# Patient Record
Sex: Male | Born: 1995
Health system: Southern US, Community
[De-identification: ages and names within clinical notes are randomized; demographics above are authoritative.]

---

## 2019-02-10 ENCOUNTER — Encounter (HOSPITAL_COMMUNITY): Payer: Self-pay | Admitting: Emergency Medicine

## 2019-02-10 ENCOUNTER — Other Ambulatory Visit: Payer: Self-pay

## 2019-02-10 ENCOUNTER — Observation Stay (HOSPITAL_COMMUNITY)
Admission: EM | Admit: 2019-02-10 | Discharge: 2019-02-11 | Disposition: A | Discharge status: Home / Self Care | Payer: HRSA Program | Attending: Family Medicine | Admitting: Family Medicine

## 2019-02-10 ENCOUNTER — Emergency Department (HOSPITAL_COMMUNITY): Payer: HRSA Program

## 2019-02-10 DIAGNOSIS — R0602 Shortness of breath: Secondary | ICD-10-CM | POA: Diagnosis present

## 2019-02-10 DIAGNOSIS — J9601 Acute respiratory failure with hypoxia: Secondary | ICD-10-CM | POA: Insufficient documentation

## 2019-02-10 DIAGNOSIS — Z886 Allergy status to analgesic agent status: Secondary | ICD-10-CM | POA: Diagnosis not present

## 2019-02-10 DIAGNOSIS — Z20822 Contact with and (suspected) exposure to covid-19: Secondary | ICD-10-CM

## 2019-02-10 DIAGNOSIS — U071 COVID-19: Secondary | ICD-10-CM | POA: Diagnosis not present

## 2019-02-10 DIAGNOSIS — R0902 Hypoxemia: Secondary | ICD-10-CM | POA: Diagnosis not present

## 2019-02-10 DIAGNOSIS — J1289 Other viral pneumonia: Secondary | ICD-10-CM | POA: Insufficient documentation

## 2019-02-10 DIAGNOSIS — J1282 Pneumonia due to coronavirus disease 2019: Secondary | ICD-10-CM

## 2019-02-10 HISTORY — DX: COVID-19: U07.1

## 2019-02-10 HISTORY — DX: Pneumonia due to coronavirus disease 2019: J12.82

## 2019-02-10 LAB — COMPREHENSIVE METABOLIC PANEL
ALT: 49 U/L — ABNORMAL HIGH (ref 0–44)
AST: 38 U/L (ref 15–41)
Albumin: 3.6 g/dL (ref 3.5–5.0)
Alkaline Phosphatase: 51 U/L (ref 38–126)
Anion gap: 11 (ref 5–15)
BUN: 8 mg/dL (ref 6–20)
CO2: 21 mmol/L — ABNORMAL LOW (ref 22–32)
Calcium: 8.5 mg/dL — ABNORMAL LOW (ref 8.9–10.3)
Chloride: 104 mmol/L (ref 98–111)
Creatinine, Ser: 0.94 mg/dL (ref 0.61–1.24)
GFR calc Af Amer: >60 mL/min (ref >60)
GFR calc non Af Amer: >60 mL/min (ref >60)
Glucose, Bld: 118 mg/dL — ABNORMAL HIGH (ref 70–99)
Potassium: 3.7 mmol/L (ref 3.5–5.1)
Sodium: 136 mmol/L (ref 135–145)
Total Bilirubin: 0.4 mg/dL (ref 0.3–1.2)
Total Protein: 7 g/dL (ref 6.5–8.1)

## 2019-02-10 LAB — CBC WITH DIFFERENTIAL/PLATELET
Abs Immature Granulocytes: 0.03 10*3/uL (ref 0.00–0.07)
Abs Immature Granulocytes: 0.01 10*3/uL (ref 0.00–0.07)
Basophils Absolute: 0 10*3/uL (ref 0.0–0.1)
Basophils Absolute: 0 10*3/uL (ref 0.0–0.1)
Basophils Relative: 0 %
Basophils Relative: 0 %
Eosinophils Absolute: 0 10*3/uL (ref 0.0–0.5)
Eosinophils Absolute: 0 10*3/uL (ref 0.0–0.5)
Eosinophils Relative: 1 %
Eosinophils Relative: 0 %
HCT: 45.7 % (ref 39.0–52.0)
HCT: 46.6 % (ref 39.0–52.0)
Hemoglobin: 15.7 g/dL (ref 13.0–17.0)
Hemoglobin: 15.9 g/dL (ref 13.0–17.0)
Immature Granulocytes: 1 %
Immature Granulocytes: 0 %
Lymphocytes Relative: 15 %
Lymphocytes Relative: 10 %
Lymphs Abs: 0.8 10*3/uL (ref 0.7–4.0)
Lymphs Abs: 0.4 10*3/uL — ABNORMAL LOW (ref 0.7–4.0)
MCH: 30.8 pg (ref 26.0–34.0)
MCH: 30.5 pg (ref 26.0–34.0)
MCHC: 34.4 g/dL (ref 30.0–36.0)
MCHC: 34.1 g/dL (ref 30.0–36.0)
MCV: 89.8 fL (ref 80.0–100.0)
MCV: 89.4 fL (ref 80.0–100.0)
Monocytes Absolute: 0.6 10*3/uL (ref 0.1–1.0)
Monocytes Absolute: 0.1 10*3/uL (ref 0.1–1.0)
Monocytes Relative: 12 %
Monocytes Relative: 3 %
Neutro Abs: 3.6 10*3/uL (ref 1.7–7.7)
Neutro Abs: 4 10*3/uL (ref 1.7–7.7)
Neutrophils Relative %: 71 %
Neutrophils Relative %: 87 %
Platelets: 181 10*3/uL (ref 150–400)
Platelets: 185 10*3/uL (ref 150–400)
RBC: 5.09 MIL/uL (ref 4.22–5.81)
RBC: 5.21 MIL/uL (ref 4.22–5.81)
RDW: 11.9 % (ref 11.5–15.5)
RDW: 11.9 % (ref 11.5–15.5)
WBC: 5 10*3/uL (ref 4.0–10.5)
WBC: 4.6 10*3/uL (ref 4.0–10.5)
nRBC: 0 % (ref 0.0–0.2)
nRBC: 0 % (ref 0.0–0.2)

## 2019-02-10 LAB — LACTIC ACID, PLASMA: Lactic Acid, Venous: 1.8 mmol/L (ref 0.5–1.9)

## 2019-02-10 LAB — TRIGLYCERIDES: Triglycerides: 78 mg/dL (ref <150)

## 2019-02-10 LAB — LACTATE DEHYDROGENASE: LDH: 253 U/L — ABNORMAL HIGH (ref 98–192)

## 2019-02-10 LAB — PROCALCITONIN: Procalcitonin: 0.45 ng/mL

## 2019-02-10 LAB — RESPIRATORY PANEL BY RT PCR (FLU A&B, COVID)
Influenza A by PCR: NEGATIVE
Influenza B by PCR: NEGATIVE
SARS Coronavirus 2 by RT PCR: POSITIVE — AB

## 2019-02-10 LAB — FERRITIN: Ferritin: 480 ng/mL — ABNORMAL HIGH (ref 24–336)

## 2019-02-10 LAB — D-DIMER, QUANTITATIVE: D-Dimer, Quant: 0.39 ug/mL-FEU (ref 0.00–0.50)

## 2019-02-10 LAB — POC SARS CORONAVIRUS 2 AG -  ED: SARS Coronavirus 2 Ag: NEGATIVE

## 2019-02-10 LAB — FIBRINOGEN: Fibrinogen: 677 mg/dL — ABNORMAL HIGH (ref 210–475)

## 2019-02-10 LAB — C-REACTIVE PROTEIN: CRP: 4.9 mg/dL — ABNORMAL HIGH (ref <1.0)

## 2019-02-10 MED ORDER — DEXAMETHASONE SODIUM PHOSPHATE 10 MG/ML IJ SOLN
10.0000 mg | Freq: Once | INTRAMUSCULAR | Status: AC
  Administered 2019-02-10: 10 mg via INTRAVENOUS
  Filled 2019-02-10: qty 1

## 2019-02-10 MED ORDER — ACETAMINOPHEN 325 MG PO TABS
650.0000 mg | ORAL_TABLET | Freq: Four times a day (QID) | ORAL | Status: DC | PRN
  Administered 2019-02-10: 650 mg via ORAL
  Filled 2019-02-10 (×2): qty 2

## 2019-02-10 MED ORDER — ACETAMINOPHEN 500 MG PO TABS
1000.0000 mg | ORAL_TABLET | Freq: Once | ORAL | Status: AC
  Administered 2019-02-10: 1000 mg via ORAL
  Filled 2019-02-10: qty 2

## 2019-02-10 MED ORDER — DEXAMETHASONE 6 MG PO TABS
6.0000 mg | ORAL_TABLET | ORAL | Status: DC
  Administered 2019-02-11: 6 mg via ORAL
  Filled 2019-02-10: qty 1

## 2019-02-10 MED ORDER — GUAIFENESIN 100 MG/5ML PO SOLN
5.0000 mL | ORAL | Status: DC | PRN
  Administered 2019-02-10: 100 mg via ORAL
  Filled 2019-02-10: qty 5

## 2019-02-10 MED ORDER — ENOXAPARIN SODIUM 40 MG/0.4ML ~~LOC~~ SOLN
40.0000 mg | SUBCUTANEOUS | Status: DC
  Administered 2019-02-10: 40 mg via SUBCUTANEOUS
  Filled 2019-02-10: qty 0.4

## 2019-02-10 NOTE — ED Notes (Signed)
Pt placed in bed, rather than chair/bed.

## 2019-02-10 NOTE — Discharge Summary (Signed)
[facility] Discharge Summary  Patient name: Terry Boone Medical record number: [id number] Date of birth: 23-Jun-1995 Age: 23 y.o. Gender: male Date of Admission: 02/10/2019  Date of Discharge: 02/11/2019 Admitting Physician: Stark Klein, MD  Primary Care Provider: Patient, No Pcp Per Consultants: None  Indication for Hospitalization: COVID-19 Pneumonia  Discharge Diagnoses/Problem List:  Principal Problem:   Pneumonia due to COVID-19 virus Active Problems:   Hypoxia   Acute respiratory failure with hypoxia ([place])    Disposition: discharge home   Discharge Condition: improved   Discharge Exam:   Physical Exam:  General: 23 y.o. male in NAD, sitting up on edge of bed Cardio: RRR no m/r/g Lungs: Decreased lung sounds throughout all lung fields, able to auscultate air movement at bases bilaterally, no obvious wheezing, no increased work of breathing on room air Skin: warm and dry Extremities: No edema  Brief Hospital Course:  [name] a 23 y.o.malewith no significant past, who presented withshortness of breath, found to be COVID-19 positive. [place].  COVID-19 Pneumonia Patient presented with complaints of shortness of breath, in ED received 1 dose of dexamethasone with good improvement.  He remained dyspneic and O2 sats were in high 80s with ambulation.  He remained in the hospital overnight, O2 sats remained stable on room air.  He was continued on dexamethasone.  On the day of discharge, he was able to ambulate without O2 with pulse ox lowest to 88.  His other vitals remained stable and lab work was within normal limits.  He was discharged home with as needed albuterol MDI for dyspnea on exertion and to complete a 10-day course of dexamethasone.  Issues for Follow Up:  1. Patient discharged home to complete 10-day course of dexamethasone.  He will be followed by my chart companion. 2. Scheduled for virtual follow-up  visit at [facility], but does not have PCP.  Please assess the patient would like to stay at our clinic. 3. Ensure that breathing is improved. 4. Self-isolation and on 12/25.  Significant Procedures: None  Significant Labs and Imaging:  Recent Labs  Lab 02/10/19 1028 02/10/19 1533 02/11/19 0500  WBC 5.0 4.6 5.7  HGB 15.7 15.9 16.0  HCT 45.7 46.6 46.9  PLT 181 185 215   Recent Labs  Lab 02/10/19 1028 02/11/19 0500  NA 136 140  K 3.7 3.8  CL 104 103  CO2 21* 24  GLUCOSE 118* 105*  BUN 8 11  CREATININE 0.94 0.76  CALCIUM 8.5* 9.0  MG  --  2.3  PHOS  --  4.0  ALKPHOS 51 46  AST 38 26  ALT 49* 42  ALBUMIN 3.6 3.7    DG Chest Port 1 View  Result Date: 02/10/2019 CLINICAL DATA:  23 year old male with history of shortness of breath, night sweats and fever for the past 2 days. EXAM: PORTABLE CHEST 1 VIEW COMPARISON:  No priors. FINDINGS: Patchy ill-defined opacities throughout the mid to lower lungs bilaterally concerning for developing multilobar bilateral pneumonia such as viral pneumonia. No pleural effusions. No evidence of pulmonary edema. No pneumothorax. Heart size is normal. Upper mediastinal contours are within normal limits. IMPRESSION: 1. The appearance the chest is highly concerning for developing multilobar bilateral atypical pneumonia such as viral pneumonia. Clinical correlation for signs and symptoms of COVID-19 infection is suggested. Electronically Signed   By: Vinnie Langton M.D.   On: 02/10/2019 10:22   Results/Tests Pending at Time of Discharge: None  Discharge Medications:  Allergies as of 02/11/2019  Reactions   Aleve [naproxen] Hives   Only happens with gel capsules per spouse, as well as any other gel capsules.   Other Hives, Other (See Comments)   Gel capsules       Medication List    STOP taking these medications   ibuprofen 200 MG tablet Commonly known as: ADVIL   MUCINEX FAST-MAX PO   NYQUIL PO   TYLENOL COLD/FLU SEVERE PO     TAKE  these medications   albuterol 108 (90 Base) MCG/ACT inhaler Commonly known as: VENTOLIN HFA Inhale 2 puffs into the lungs every 4 (four) hours as needed for wheezing or shortness of breath.   dexamethasone 6 MG tablet Commonly known as: DECADRON Take 1 tablet (6 mg total) by mouth daily for 8 days. Start taking on: February 12, 2019       Discharge Instructions: Please refer to Patient Instructions section of EMR for full details.  Patient was counseled important signs and symptoms that should prompt return to medical care, changes in medications, dietary instructions, activity restrictions, and follow up appointments.   Follow-Up Appointments: Follow-up Information    [facility] Follow up on 02/16/2019.   Why: at 9:30am.  This is a virtual appointment.  Do not come to the clinic.  You will be contacted around your appointment time by provider.  This will likely come in the form of a text message, click on the link and allow video and sound. Contact information: [address] [place] [address] [phone]          Unknown Jim, DO 02/11/2019, 2:21 PM PGY-2, [facility]

## 2019-02-10 NOTE — ED Notes (Signed)
Reg diet tray ordered 

## 2019-02-10 NOTE — ED Notes (Signed)
Delivered pt tray

## 2019-02-10 NOTE — ED Notes (Signed)
Keys labeled with pt sticker and note that wife is picking up given to security desk in the lobby.

## 2019-02-10 NOTE — H&P (Addendum)
Family Medicine Teaching [facility] Admission History and Physical Service Pager: [phone]  Patient name: Terry Boone Medical record number: [id number] Date of birth: 01-03-1996 Age: 23 y.o. Gender: male  Primary Care Provider: Patient, No Pcp Per Consultants: none Code Status: Full Preferred Emergency Contact: Malachi Bonds, wife: [phone]  Chief Complaint: shortness of breath  Assessment and Plan: Terry Boone is a 23 y.o. male with no significant pastpresenting with shortness of breath. No significant PMH.    COVID-19 pneumonia Patient presents with 8 days of fever, chills, dyspnea on exertion, body aches and sore throat.  Patient has been confirmed positive for COVID-19.  Is not requiring supplemental oxygen on admission exam, and is saturating 95-97% on room air at rest.  Patient is noted to desaturate into the high 80s with ambulation and reports experiencing shortness of breath with ambulation.  In ED patient started on oral dexamethasone 6 mg.  Patient reports that he feels better following administration of dexamethasone 10mg  in ED. LDH elevated at 253, ferritin elevated at 480, fibrinogen elevated at 677, CRP 4.9, RVP +for COVID-19, WBC normal at 4.6, hemoglobin WNL at 15.9. D-dimer within normal limits at 0.39, pro-calcitonin 0.45. CXR significant for multilobar bilateral atypical pneumonia consistent confirmed viral infection.  Patient's  Symptoms are consistent with COVID-19 infection and subjectively improved with administration of PO dexamethasone. Will admit for observation and consider discharge with five day course of PO steroids on 02/11/19 if continues to do well overnight. Patient's respiratory status is reassuring as he is not requiring supplemental oxygen and still able to maintain saturations in the mid-high 90% at rest with desats to high 80s with exertion. Will advance if more oxygen required overnight.  -Admit to medical telemetry, attending Dr. 02/13/19  -Continue  p.o. dexamethasone at 6 mg dose -continuous pulse oximetry  -HIV -airborne and contact precautions  -will f/u CRP, CBC, CMP, D-dimer, ferritin, mag and phosphorus  -Tylenol PRN for pain/fever  -vitals per floor protocol   FEN/GI: Regular diet Prophylaxis: lovenox  Disposition: place in observation on medical telemetry   History of Present Illness:  Terry Boone is a 23 y.o. male presenting with 8 days of subjective fever, chills, cough and sore throat. Patient reports that he began to experience dyspnea on exertion while climbing a ladder while at work 1 day prior to presenting to the ED. Patient reports no known contacts including those with confirmed or suspected COVID-19.  Last Monday, he started having chills and fever. Last week when he was working he was short of breath. He came in today to see what was going on. He climbs ladders at work and usually has no problems with that. He reports he also developed cough, sore throat and body aches. He did not check his temperature at home. He reports he was having some night sweats that he thinks was due to fever. He reports his cough has been a dry cough. He has had no known contacts to covid. No history of asthma or lung problems. No heart history. No history of smoking. He reports some cramping in his chest with cough only, but no other chest pain. He has had a headache with fever and states that ibuprofen helped to relieve his symptoms. He reports his shortness of breath is only when he exerts himself. He reports that he has also has tried Nyquil and tylenol to combat symptoms as he thought he had a common cold. He lives with wife and 2 kids and no one else is feeling ill  at this time.   Review Of Systems: Per HPI with the following additions:   Review of Systems  Constitutional: Positive for chills and fever.  HENT: Positive for congestion and sore throat. Negative for ear pain.   Eyes: Negative for pain.  Respiratory: Positive for cough  and shortness of breath. Negative for sputum production.   Cardiovascular: Negative for chest pain.  Gastrointestinal: Negative for constipation, diarrhea, nausea and vomiting.  Genitourinary: Negative for dysuria, frequency and urgency.  Musculoskeletal: Positive for myalgias.  Skin: Negative for rash.  Neurological: Positive for headaches. Negative for dizziness.   Patient Active Problem List   Diagnosis Date Noted  . Hypoxia 02/10/2019  . COVID-19 02/10/2019   Past Medical History: History reviewed. No pertinent past medical history.  Past Surgical History: History reviewed. No pertinent surgical history.  Social History: Social History   Tobacco Use  . Smoking status: Never Smoker  . Smokeless tobacco: Never Used  Substance Use Topics  . Alcohol use: Yes  . Drug use: Not Currently   Additional social history: Denies smoking, drinks socially- drinks 1-2 beers everyday; has not drank since his illness started, no recreational drug use Please also refer to relevant sections of EMR.  Family History: Family History  Problem Relation Age of Onset  . Diabetes Father     Allergies and Medications: Allergies  Allergen Reactions  . Aleve [Naproxen] Hives    Only happens with gel capsules per spouse, as well as any other gel capsules.  . Other Hives and Other (See Comments)    Gel capsules    No current facility-administered medications on file prior to encounter.   Current Outpatient Medications on File Prior to Encounter  Medication Sig Dispense Refill  . ibuprofen (ADVIL) 200 MG tablet Take 600-800 mg by mouth at bedtime.    Marland Kitchen Phenylephrine-APAP-guaiFENesin (MUCINEX FAST-MAX PO) Take 2 tablets by mouth at bedtime.    Marland Kitchen Phenylephrine-DM-GG-APAP (TYLENOL COLD/FLU SEVERE PO) Take 2 capsules by mouth at bedtime.    . Pseudoeph-Doxylamine-DM-APAP (NYQUIL PO) Take 15 mLs by mouth at bedtime.     He reports that the advil liquid-gels have given him a  rash but the regular  pill form does not cause rash.   Objective: BP 117/75   Pulse 96   Temp 97.9 F (36.6 C) (Oral)   Resp (!) 25   SpO2 97%   Exam: General: Male appearing stated age, in no acute distress, pleasant, conversational Eyes: Sclera nonicteric, extraocular muscles Neck: Supraclavicular or posterior auricular lymphadenopathy, no tenderness, neck is supple with no rigidity Cardiovascular: Regular rate and rhythm or friction, rub radial pulses palpated Respiratory: Decreased lung throughout the lung fields, positive airway movement in basilar lobes, restricted airway movement in upper bilateral lobes, normal respiratory effort  Gastrointestinal: Soft, bowel sounds present Derm: No rashes, dry and intact Neuro: Alert and oriented x4 Psych: Normal affect  Labs and Imaging: CBC BMET  Recent Labs  Lab 02/10/19 1533  WBC 4.6  HGB 15.9  HCT 46.6  PLT 185   Recent Labs  Lab 02/10/19 1028  NA 136  K 3.7  CL 104  CO2 21*  BUN 8  CREATININE 0.94  GLUCOSE 118*  CALCIUM 8.5*     EKG: normal   DG Chest Port 1 View  Result Date: 02/10/2019 CLINICAL DATA:  23 year old male with history of shortness of breath, night sweats and fever for the past 2 days. EXAM: PORTABLE CHEST 1 VIEW COMPARISON:  No priors. FINDINGS: Patchy  ill-defined opacities throughout the mid to lower lungs bilaterally concerning for developing multilobar bilateral pneumonia such as viral pneumonia. No pleural effusions. No evidence of pulmonary edema. No pneumothorax. Heart size is normal. Upper mediastinal contours are within normal limits. IMPRESSION: 1. The appearance the chest is highly concerning for developing multilobar bilateral atypical pneumonia such as viral pneumonia. Clinical correlation for signs and symptoms of COVID-19 infection is suggested. Electronically Signed   By: Trudie Reedaniel  Entrikin M.D.   On: 02/10/2019 10:22     Nicki GuadalajaraSimmons, Meghann [place], MD 02/10/2019, 6:35 PM PGY-1, [facility]  Intern pager: [phone], text pages welcome

## 2019-02-10 NOTE — ED Triage Notes (Signed)
C/o SOB since yesterday.  Reports chills, sore throat, body aches, and fatigue x 1 week.

## 2019-02-10 NOTE — ED Notes (Signed)
Pt O2 started at 95%. Ambulated in room and dropped to 89. Pt stated he felt short of breath. Notified PA.

## 2019-02-10 NOTE — ED Provider Notes (Signed)
[facility] EMERGENCY DEPARTMENT Provider Note   CSN: [id number] Arrival date & time: 02/10/19  8127     History Chief Complaint  Patient presents with  . ? COVID  . Shortness of Breath  . Chills    Terry Boone is a 23 y.o. male presents for evaluation of acute onset, progressively worsening subjective fevers and chills, cough, sore throat for 8 days.  Reports yesterday feeling short of breath while walking up stairs at work.  Also developed some mild chest pains with cough only yesterday which have since resolved.  Denies nausea, vomiting, abdominal pain.  Cough is nonproductive.  Has been taking over-the-counter medications including ibuprofen, DayQuil with some relief.  No known sick contacts.  He is a non-smoker.  No recent travel or surgeries, no hemoptysis, no prior history of DVT or PE.  The history is provided by the patient.       History reviewed. No pertinent past medical history.  Patient Active Problem List   Diagnosis Date Noted  . Hypoxia 02/10/2019    History reviewed. No pertinent surgical history.     No family history on file.  Social History   Tobacco Use  . Smoking status: Never Smoker  . Smokeless tobacco: Never Used  Substance Use Topics  . Alcohol use: Yes  . Drug use: Not Currently    Home Medications Prior to Admission medications   Not on File    Allergies    Patient has no allergy information on record.  Review of Systems   Review of Systems  Constitutional: Positive for chills and fever.  HENT: Positive for congestion and sore throat. Negative for drooling.   Respiratory: Positive for cough and shortness of breath.   Cardiovascular: Positive for chest pain (With cough only). Negative for leg swelling.  Gastrointestinal: Negative for abdominal pain, constipation, diarrhea, nausea and vomiting.  All other systems reviewed and are negative.   Physical Exam Updated Vital Signs BP 96/62   Pulse 96   Temp  99.7 F (37.6 C) (Oral)   Resp 18   SpO2 96%   Physical Exam Vitals and nursing note reviewed.  Constitutional:      General: He is not in acute distress.    Appearance: He is well-developed.  HENT:     Head: Normocephalic and atraumatic.  Eyes:     General:        Right eye: No discharge.        Left eye: No discharge.     Conjunctiva/sclera: Conjunctivae normal.  Neck:     Vascular: No JVD.     Trachea: No tracheal deviation.  Cardiovascular:     Rate and Rhythm: Regular rhythm.     Pulses: Normal pulses.     Comments: Mildly tachycardic at rest up to 103 bpm, 2+ radial and DP/PT pulses bilaterally, Homans sign absent bilaterally, no lower extremity edema, no palpable cords, compartments are soft  Pulmonary:     Effort: Pulmonary effort is normal.     Comments: Speaking in full sentences without difficulty, SPO2 saturations 92 to 97% on room air.  Patient was ambulated in the room with heart rate increasing to 112 bpm and O2 saturations dropping to 89% on room air but quickly went up to 96% with rest.  Abdominal:     General: There is no distension.     Palpations: Abdomen is soft.     Tenderness: There is no abdominal tenderness.  Musculoskeletal:  Right lower leg: No tenderness. No edema.     Left lower leg: No tenderness. No edema.  Skin:    Findings: No erythema.  Neurological:     Mental Status: He is alert.  Psychiatric:        Behavior: Behavior normal.     ED Results / Procedures / Treatments   Labs (all labs ordered are listed, but only abnormal results are displayed) Labs Reviewed  COMPREHENSIVE METABOLIC PANEL - Abnormal; Notable for the following components:      Result Value   CO2 21 (*)    Glucose, Bld 118 (*)    Calcium 8.5 (*)    ALT 49 (*)    All other components within normal limits  LACTATE DEHYDROGENASE - Abnormal; Notable for the following components:   LDH 253 (*)    All other components within normal limits  FERRITIN - Abnormal;  Notable for the following components:   Ferritin 480 (*)    All other components within normal limits  FIBRINOGEN - Abnormal; Notable for the following components:   Fibrinogen 677 (*)    All other components within normal limits  C-REACTIVE PROTEIN - Abnormal; Notable for the following components:   CRP 4.9 (*)    All other components within normal limits  CULTURE, BLOOD (ROUTINE X 2)  CULTURE, BLOOD (ROUTINE X 2)  RESPIRATORY PANEL BY RT PCR (FLU A&B, COVID)  LACTIC ACID, PLASMA  CBC WITH DIFFERENTIAL/PLATELET  D-DIMER, QUANTITATIVE (NOT AT [facility])  PROCALCITONIN  TRIGLYCERIDES  POC SARS CORONAVIRUS 2 AG -  ED    EKG None  Radiology DG Chest Port 1 View  Result Date: 02/10/2019 CLINICAL DATA:  23 year old male with history of shortness of breath, night sweats and fever for the past 2 days. EXAM: PORTABLE CHEST 1 VIEW COMPARISON:  No priors. FINDINGS: Patchy ill-defined opacities throughout the mid to lower lungs bilaterally concerning for developing multilobar bilateral pneumonia such as viral pneumonia. No pleural effusions. No evidence of pulmonary edema. No pneumothorax. Heart size is normal. Upper mediastinal contours are within normal limits. IMPRESSION: 1. The appearance the chest is highly concerning for developing multilobar bilateral atypical pneumonia such as viral pneumonia. Clinical correlation for signs and symptoms of COVID-19 infection is suggested. Electronically Signed   By: Trudie Reedaniel  Entrikin M.D.   On: 02/10/2019 10:22    Procedures Procedures (including critical care time)  Medications Ordered in ED Medications  acetaminophen (TYLENOL) tablet 1,000 mg (1,000 mg Oral Given 02/10/19 1028)  dexamethasone (DECADRON) injection 10 mg (10 mg Intravenous Given 02/10/19 1032)    ED Course  I have reviewed the triage vital signs and the nursing notes.  Pertinent labs & imaging results that were available during my care of the patient were reviewed by me and  considered in my medical decision making (see chart for details).    MDM Rules/Calculators/A&P                      Gerlene FeeJuan Boone was evaluated in Emergency Department on 02/10/2019 for the symptoms described in the history of present illness. He was evaluated in the context of the global COVID-19 pandemic, which necessitated consideration that the patient might be at risk for infection with the SARS-CoV-2 virus that causes COVID-19. Institutional protocols and algorithms that pertain to the evaluation of patients at risk for COVID-19 are in a state of rapid change based on information released by regulatory bodies including the CDC and federal and state organizations. These  policies and algorithms were followed during the patient's care in the ED.  Patient presenting for evaluation of progressively worsening cough, fever, chills, myalgias.  Developed dyspnea on exertion yesterday, had difficulty catching his breath while ambulating up stairs.  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  Chest x-ray concerning for atypical multilobar pneumonia.  His rapid Covid test is negative but I have a high suspicion of Covid in this patient, will obtain 2-hour test.  Patient initially afebrile in the ED, SPO2 saturations 92 to 96% on room air but patient was ambulated in the ED with desaturations down to 89% and heart rate up to the 120s.  Lab work reviewed by me shows no leukocytosis, no anemia, no renal insufficiency.  He was given IV Decadron in the ED.  Given desaturation with ambulation I feel he would benefit from admission to the hospital for further evaluation and management of possible Covid.  Family medicine teaching service to admit.  Covid test is pending.  He is currently not requiring any supplemental oxygen at rest.  Final Clinical Impression(s) / ED Diagnoses Final diagnoses:  Suspected COVID-19 virus infection  Acute respiratory failure with hypoxia [facility])    Rx / DC  Orders ED Discharge Orders    None       Jeanie Sewer, PA-C 02/10/19 1622    Pricilla Loveless, MD 02/11/19 1734

## 2019-02-11 ENCOUNTER — Encounter (HOSPITAL_COMMUNITY): Payer: Self-pay | Admitting: Family Medicine

## 2019-02-11 DIAGNOSIS — J9601 Acute respiratory failure with hypoxia: Secondary | ICD-10-CM

## 2019-02-11 LAB — CBC WITH DIFFERENTIAL/PLATELET
Abs Immature Granulocytes: 0.02 10*3/uL (ref 0.00–0.07)
Basophils Absolute: 0 10*3/uL (ref 0.0–0.1)
Basophils Relative: 0 %
Eosinophils Absolute: 0 10*3/uL (ref 0.0–0.5)
Eosinophils Relative: 0 %
HCT: 46.9 % (ref 39.0–52.0)
Hemoglobin: 16 g/dL (ref 13.0–17.0)
Immature Granulocytes: 0 %
Lymphocytes Relative: 17 %
Lymphs Abs: 1 10*3/uL (ref 0.7–4.0)
MCH: 30.8 pg (ref 26.0–34.0)
MCHC: 34.1 g/dL (ref 30.0–36.0)
MCV: 90.4 fL (ref 80.0–100.0)
Monocytes Absolute: 0.7 10*3/uL (ref 0.1–1.0)
Monocytes Relative: 13 %
Neutro Abs: 4 10*3/uL (ref 1.7–7.7)
Neutrophils Relative %: 70 %
Platelets: 215 10*3/uL (ref 150–400)
RBC: 5.19 MIL/uL (ref 4.22–5.81)
RDW: 11.7 % (ref 11.5–15.5)
WBC: 5.7 10*3/uL (ref 4.0–10.5)
nRBC: 0 % (ref 0.0–0.2)

## 2019-02-11 LAB — D-DIMER, QUANTITATIVE: D-Dimer, Quant: <0.27 ug/mL-FEU (ref 0.00–0.50)

## 2019-02-11 LAB — COMPREHENSIVE METABOLIC PANEL
ALT: 42 U/L (ref 0–44)
AST: 26 U/L (ref 15–41)
Albumin: 3.7 g/dL (ref 3.5–5.0)
Alkaline Phosphatase: 46 U/L (ref 38–126)
Anion gap: 13 (ref 5–15)
BUN: 11 mg/dL (ref 6–20)
CO2: 24 mmol/L (ref 22–32)
Calcium: 9 mg/dL (ref 8.9–10.3)
Chloride: 103 mmol/L (ref 98–111)
Creatinine, Ser: 0.76 mg/dL (ref 0.61–1.24)
GFR calc Af Amer: >60 mL/min (ref >60)
GFR calc non Af Amer: >60 mL/min (ref >60)
Glucose, Bld: 105 mg/dL — ABNORMAL HIGH (ref 70–99)
Potassium: 3.8 mmol/L (ref 3.5–5.1)
Sodium: 140 mmol/L (ref 135–145)
Total Bilirubin: 0.5 mg/dL (ref 0.3–1.2)
Total Protein: 7.2 g/dL (ref 6.5–8.1)

## 2019-02-11 LAB — FERRITIN: Ferritin: 438 ng/mL — ABNORMAL HIGH (ref 24–336)

## 2019-02-11 LAB — ABO/RH: ABO/RH(D): O POS

## 2019-02-11 LAB — HIV ANTIBODY (ROUTINE TESTING W REFLEX): HIV Screen 4th Generation wRfx: NONREACTIVE

## 2019-02-11 LAB — MAGNESIUM: Magnesium: 2.3 mg/dL (ref 1.7–2.4)

## 2019-02-11 LAB — C-REACTIVE PROTEIN: CRP: 7.1 mg/dL — ABNORMAL HIGH (ref <1.0)

## 2019-02-11 LAB — PHOSPHORUS: Phosphorus: 4 mg/dL (ref 2.5–4.6)

## 2019-02-11 MED ORDER — ALBUTEROL SULFATE HFA 108 (90 BASE) MCG/ACT IN AERS: 2.0000 | INHALATION_SPRAY | RESPIRATORY_TRACT | 0 refills | Status: AC | PRN

## 2019-02-11 MED ORDER — ALBUTEROL SULFATE HFA 108 (90 BASE) MCG/ACT IN AERS
2.0000 | INHALATION_SPRAY | RESPIRATORY_TRACT | Status: DC | PRN
  Filled 2019-02-11: qty 6.7

## 2019-02-11 MED ORDER — DEXAMETHASONE 6 MG PO TABS: 6.0000 mg | ORAL_TABLET | ORAL | 0 refills | Status: AC

## 2019-02-11 MED ORDER — ALBUTEROL SULFATE HFA 108 (90 BASE) MCG/ACT IN AERS: 2.0000 | INHALATION_SPRAY | RESPIRATORY_TRACT | 0 refills | Status: DC | PRN

## 2019-02-11 MED ORDER — AEROCHAMBER PLUS FLO-VU MEDIUM MISC
1.0000 | Freq: Once | Status: DC
  Filled 2019-02-11: qty 1

## 2019-02-11 MED ORDER — DEXAMETHASONE 6 MG PO TABS: 6.0000 mg | ORAL_TABLET | ORAL | 0 refills | Status: DC

## 2019-02-11 MED FILL — DEXAMETHASONE 2 MG TABLET: 2 | 8 days supply | Qty: 24 | Fill #0

## 2019-02-11 MED FILL — VENTOLIN HFA 90 MCG INHALER: 108 (90 BAS | 15 days supply | Qty: 18 | Fill #0

## 2019-02-11 NOTE — Progress Notes (Signed)
Patient discharge teaching given, including activity, diet, follow-up appoints, and medications. Patient verbalized understanding of all discharge instructions. IV access was d/c'd. Vitals are stable. Skin is intact except as charted in most recent assessments. Pt to be escorted out by NT, to be driven home by family. 

## 2019-02-11 NOTE — Progress Notes (Signed)
  Patient Saturations on Room Air at Rest = 94%  Patient Saturations on Room Air while Ambulating = 88%  Patient ambulated 150 feet around unit. Patient began coughing upon walking and complained of SOB and returned to bed. Oxygen saturations dropped to 88% and maintained until patient was back in bed, where O2 sats returned to 94%.  No c/o SOB at rest.

## 2019-02-11 NOTE — Progress Notes (Signed)
Family Medicine Teaching Service Daily Progress Note Intern Pager: [phone]  Patient name: Terry Boone Medical record number: [id number] Date of birth: [birth year] Age: 23 y.o. Gender: male  Primary Care Provider: Patient, No Pcp Per Consultants: None Code Status: Full  Pt Overview and Major Events to Date:  12/22 Admitted to FPTS  Assessment and Plan: Keino Placencia is a 23 y.o. male with no significant pastpresenting with shortness of breath. No significant PMH.    COVID-19 pneumonia Seems to be at day 9 of symptoms.  Overnight had appropriate saturations on room air, was kept inpatient due to desaturation with ambulating yesterday in ED.  Patient did have times overnight T-max 100.9. CRP 4.9>7.1, D-Dimer 0.39> <0.27. CMP and CBC stable.  Was continued on steroids after improvement of dose of dexamethasone in the ED. patient reports that he is feeling well today and would like to go home.  Does not have a PCP agrees to follow-up with family medicine clinic with virtual visit. -Continue p.o. dexamethasone at 6 mg dose, 5 days total -continuous pulse oximetry  -HIV in process -airborne and contact precautions  -Tylenol PRN for pain/fever  -vitals per floor protocol  -Ambulate with pulse ox, likely home today  FEN/GI: Regular   PPx: Lovenox  Disposition: Likely home today  Subjective:  Patient reports that he is feeling well today.  Denies any current shortness of breath.  Has not yet been ambulated with pulse ox by the nurse, but was told that that would be happening in a little while.  States that he would like to go home today.  Objective: Temp:  [97.9 F (36.6 C)-100.9 F (38.3 C)] 99.5 F (37.5 C) (12/23 0000) Pulse Rate:  [83-96] 83 (12/22 2000) Resp:  [17-24] 17 (12/22 2000) BP: (80-127)/(59-78) 102/69 (12/22 2000) SpO2:  [95 %-97 %] 95 % (12/22 2000) Weight:  [83 kg] 83 kg (12/22 2104)  Physical Exam:  General: 23 y.o. male in NAD, sitting up on edge of  bed Cardio: RRR no m/r/g Lungs: Decreased lung sounds throughout all lung fields, able to auscultate air movement at bases bilaterally, no obvious wheezing, no increased work of breathing on room air Skin: warm and dry Extremities: No edema   Laboratory: Recent Labs  Lab 02/10/19 1028 02/10/19 1533 02/11/19 0500  WBC 5.0 4.6 5.7  HGB 15.7 15.9 16.0  HCT 45.7 46.6 46.9  PLT 181 185 215   Recent Labs  Lab 02/10/19 1028 02/11/19 0500  NA 136 140  K 3.7 3.8  CL 104 103  CO2 21* 24  BUN 8 11  CREATININE 0.94 0.76  CALCIUM 8.5* 9.0  PROT 7.0 7.2  BILITOT 0.4 0.5  ALKPHOS 51 46  ALT 49* 42  AST 38 26  GLUCOSE 118* 105*     Imaging/Diagnostic Tests: DG Chest Port 1 View  Result Date: 02/10/2019 CLINICAL DATA:  23 year old male with history of shortness of breath, night sweats and fever for the past 2 days. EXAM: PORTABLE CHEST 1 VIEW COMPARISON:  No priors. FINDINGS: Patchy ill-defined opacities throughout the mid to lower lungs bilaterally concerning for developing multilobar bilateral pneumonia such as viral pneumonia. No pleural effusions. No evidence of pulmonary edema. No pneumothorax. Heart size is normal. Upper mediastinal contours are within normal limits. IMPRESSION: 1. The appearance the chest is highly concerning for developing multilobar bilateral atypical pneumonia such as viral pneumonia. Clinical correlation for signs and symptoms of COVID-19 infection is suggested. Electronically Signed   By: Mauri Brooklyn.D.  On: 02/10/2019 10:22     Yaritza Leist, Solmon Ice, DO 02/11/2019, 8:27 AM PGY-2, [place] [facility] FPTS Intern pager: [phone], text pages welcome

## 2019-02-11 NOTE — TOC Transition Note (Signed)
Transition of Care [facility]) - CM/SW Discharge Note   Patient Details  Name: Terry Boone MRN: [id number] Date of Birth: 04/09/1995  [facility] CM/SW Contact:  Maryclare Labrador, RN Phone Number: 02/11/2019, 3:19 PM   Clinical Narrative:   CM unable to reach pt via phone.  CM was able to reach pts wife.  Wife will pick pt up at discharge and can pay for discharge medications.  [place] will fill prescriptions prior to discharge.  Pt has follow up appt with Family Medicine.  No other CM needs determined at this time - CM signing off   Final next level of care: Home/Self Care Barriers to Discharge: Barriers Resolved   Patient Goals and CMS Choice        Discharge Placement                       Discharge Plan and Services                                     Social Determinants of Health (SDOH) Interventions     Readmission Risk Interventions No flowsheet data found.

## 2019-02-11 NOTE — Discharge Instructions (Signed)
Person Under Monitoring Name: Terry Boone  Location: [address] [place] [address]   Infection Prevention Recommendations for Individuals Confirmed to have, or Being Evaluated for, 2019 Novel Coronavirus (COVID-19) Infection Who Receive Care at Home  Individuals who are confirmed to have, or are being evaluated for, COVID-19 should follow the prevention steps below until a healthcare provider or local or state health department says they can return to normal activities.  Stay home except to get medical care You should restrict activities outside your home, except for getting medical care. Do not go to work, school, or public areas, and do not use public transportation or taxis.  Call ahead before visiting your doctor Before your medical appointment, call the healthcare provider and tell them that you have, or are being evaluated for, COVID-19 infection. This will help the healthcare provider's office take steps to keep other people from getting infected. Ask your healthcare provider to call the local or state health department.  Monitor your symptoms Seek prompt medical attention if your illness is worsening (e.g., difficulty breathing). Before going to your medical appointment, call the healthcare provider and tell them that you have, or are being evaluated for, COVID-19 infection. Ask your healthcare provider to call the local or state health department.  Wear a facemask You should wear a facemask that covers your nose and mouth when you are in the same room with other people and when you visit a healthcare provider. People who live with or visit you should also wear a facemask while they are in the same room with you.  Separate yourself from other people in your home As much as possible, you should stay in a different room from other people in your home. Also, you should use a separate bathroom, if available.  Avoid sharing household items You should not share  dishes, drinking glasses, cups, eating utensils, towels, bedding, or other items with other people in your home. After using these items, you should wash them thoroughly with soap and water.  Cover your coughs and sneezes Cover your mouth and nose with a tissue when you cough or sneeze, or you can cough or sneeze into your sleeve. Throw used tissues in a lined trash can, and immediately wash your hands with soap and water for at least 20 seconds or use an alcohol-based hand rub.  Wash your [facility] your hands often and thoroughly with soap and water for at least 20 seconds. You can use an alcohol-based hand sanitizer if soap and water are not available and if your hands are not visibly dirty. Avoid touching your eyes, nose, and mouth with unwashed hands.   Prevention Steps for Caregivers and Household Members of Individuals Confirmed to have, or Being Evaluated for, COVID-19 Infection Being Cared for in the Home  If you live with, or provide care at home for, a person confirmed to have, or being evaluated for, COVID-19 infection please follow these guidelines to prevent infection:  Follow healthcare provider's instructions Make sure that you understand and can help the patient follow any healthcare provider instructions for all care.  Provide for the patient's basic needs You should help the patient with basic needs in the home and provide support for getting groceries, prescriptions, and other personal needs.  Monitor the patient's symptoms If they are getting sicker, call his or her medical provider and tell them that the patient has, or is being evaluated for, COVID-19 infection. This will help the healthcare provider's office take  steps to keep other people from getting infected. Ask the healthcare provider to call the local or state health department.  Limit the number of people who have contact with the patient  If possible, have only one caregiver for the patient.  Other  household members should stay in another home or place of residence. If this is not possible, they should stay  in another room, or be separated from the patient as much as possible. Use a separate bathroom, if available.  Restrict visitors who do not have an essential need to be in the home.  Keep older adults, very young children, and other sick people away from the patient Keep older adults, very young children, and those who have compromised immune systems or chronic health conditions away from the patient. This includes people with chronic heart, lung, or kidney conditions, diabetes, and cancer.  Ensure good ventilation Make sure that shared spaces in the home have good air flow, such as from an air conditioner or an opened window, weather permitting.  Wash your hands often  Wash your hands often and thoroughly with soap and water for at least 20 seconds. You can use an alcohol based hand sanitizer if soap and water are not available and if your hands are not visibly dirty.  Avoid touching your eyes, nose, and mouth with unwashed hands.  Use disposable paper towels to dry your hands. If not available, use dedicated cloth towels and replace them when they become wet.  Wear a facemask and gloves  Wear a disposable facemask at all times in the room and gloves when you touch or have contact with the patient's blood, body fluids, and/or secretions or excretions, such as sweat, saliva, sputum, nasal mucus, vomit, urine, or feces.  Ensure the mask fits over your nose and mouth tightly, and do not touch it during use.  Throw out disposable facemasks and gloves after using them. Do not reuse.  Wash your hands immediately after removing your facemask and gloves.  If your personal clothing becomes contaminated, carefully remove clothing and launder. Wash your hands after handling contaminated clothing.  Place all used disposable facemasks, gloves, and other waste in a lined container before  disposing them with other household waste.  Remove gloves and wash your hands immediately after handling these items.  Do not share dishes, glasses, or other household items with the patient  Avoid sharing household items. You should not share dishes, drinking glasses, cups, eating utensils, towels, bedding, or other items with a patient who is confirmed to have, or being evaluated for, COVID-19 infection.  After the person uses these items, you should wash them thoroughly with soap and water.  Wash laundry thoroughly  Immediately remove and wash clothes or bedding that have blood, body fluids, and/or secretions or excretions, such as sweat, saliva, sputum, nasal mucus, vomit, urine, or feces, on them.  Wear gloves when handling laundry from the patient.  Read and follow directions on labels of laundry or clothing items and detergent. In general, wash and dry with the warmest temperatures recommended on the label.  Clean all areas the individual has used often  Clean all touchable surfaces, such as counters, tabletops, doorknobs, bathroom fixtures, toilets, phones, keyboards, tablets, and bedside tables, every day. Also, clean any surfaces that may have blood, body fluids, and/or secretions or excretions on them.  Wear gloves when cleaning surfaces the patient has come in contact with.  Use a diluted bleach solution (e.g., dilute bleach with 1 part bleach  and 10 parts water) or a household disinfectant with a label that says EPA-registered for coronaviruses. To make a bleach solution at home, add 1 tablespoon of bleach to 1 quart (4 cups) of water. For a larger supply, add  cup of bleach to 1 gallon (16 cups) of water.  Read labels of cleaning products and follow recommendations provided on product labels. Labels contain instructions for safe and effective use of the cleaning product including precautions you should take when applying the product, such as wearing gloves or eye protection  and making sure you have good ventilation during use of the product.  Remove gloves and wash hands immediately after cleaning.  Monitor yourself for signs and symptoms of illness Caregivers and household members are considered close contacts, should monitor their health, and will be asked to limit movement outside of the home to the extent possible. Follow the monitoring steps for close contacts listed on the symptom monitoring form.   ? If you have additional questions, contact your local health department or call the epidemiologist on call at [phone] (available 24/7). ? This guidance is subject to change. For the most up-to-date guidance from [facility], please refer to their website: [email or web address]

## 2019-02-15 LAB — CULTURE, BLOOD (ROUTINE X 2)
Culture: NO GROWTH
Culture: NO GROWTH
Special Requests: ADEQUATE

## 2019-02-16 ENCOUNTER — Telehealth (INDEPENDENT_AMBULATORY_CARE_PROVIDER_SITE_OTHER): Payer: HRSA Program | Admitting: Family Medicine

## 2019-02-16 ENCOUNTER — Other Ambulatory Visit: Payer: Self-pay

## 2019-02-16 ENCOUNTER — Encounter: Payer: Self-pay | Admitting: Family Medicine

## 2019-02-16 DIAGNOSIS — J1289 Other viral pneumonia: Secondary | ICD-10-CM | POA: Diagnosis not present

## 2019-02-16 DIAGNOSIS — U071 COVID-19: Secondary | ICD-10-CM

## 2019-02-16 DIAGNOSIS — J1282 Pneumonia due to coronavirus disease 2019: Secondary | ICD-10-CM

## 2019-02-16 NOTE — Progress Notes (Signed)
[facility] Telemedicine Visit  Patient consented to have virtual visit. Method of visit: Video  Encounter participants: Patient: Terry Boone - located at home Provider: Rory Percy - located at [facility] Others (if applicable): wife  Chief Complaint: Hospital follow-up  HPI:  Admitted 12/22-12/23 for acute hypoxic respiratory failure secondary to Covid pneumonia.  Discharged home with as needed albuterol MDI and 10-day course of dexamethasone.  Reports his symptoms are resolved and he has not needed his albuterol inhaler. He has been self-isolating at home. 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 Wife tested positive on 12/23 and is currently asymptomatic, is undergoing monitoring through [employer] as she is a Furniture conservator/restorer.  ROS: per HPI  Pertinent PMHx: none  Exam:  General: overall well appearing Respiratory: speaks in full sentences, no respiratory distress at rest or ambulation  Assessment/Plan:  Pneumonia due to COVID-19 virus Improved s/p hospitalization and steroid treatment. Overall well appearing and no longer febrile with improved respiratory symptoms, now meeting CDC guidelines to end self-isolation. Note provided to be able to return to work. Advised to continue with appropriate hand washing, mask wearing, and social distancing. Patient verbalized understanding.   Time spent during visit with patient: 7 minutes

## 2019-02-16 NOTE — Assessment & Plan Note (Signed)
Improved s/p hospitalization and steroid treatment. Overall well appearing and no longer febrile with improved respiratory symptoms, now meeting CDC guidelines to end self-isolation. Note provided to be able to return to work. Advised to continue with appropriate hand washing, mask wearing, and social distancing. Patient verbalized understanding.

## 2019-09-14 ENCOUNTER — Ambulatory Visit: Payer: Self-pay

## 2020-11-08 IMAGING — DX DG CHEST 1V PORT
1 series · 1 of 1 positions shown · non-contrast
Comparison: No priors.

CLINICAL DATA: 23-year-old male with history of shortness of
breath, night sweats and fever for the past 2 days.

EXAM:
PORTABLE CHEST 1 VIEW

[chest ap]
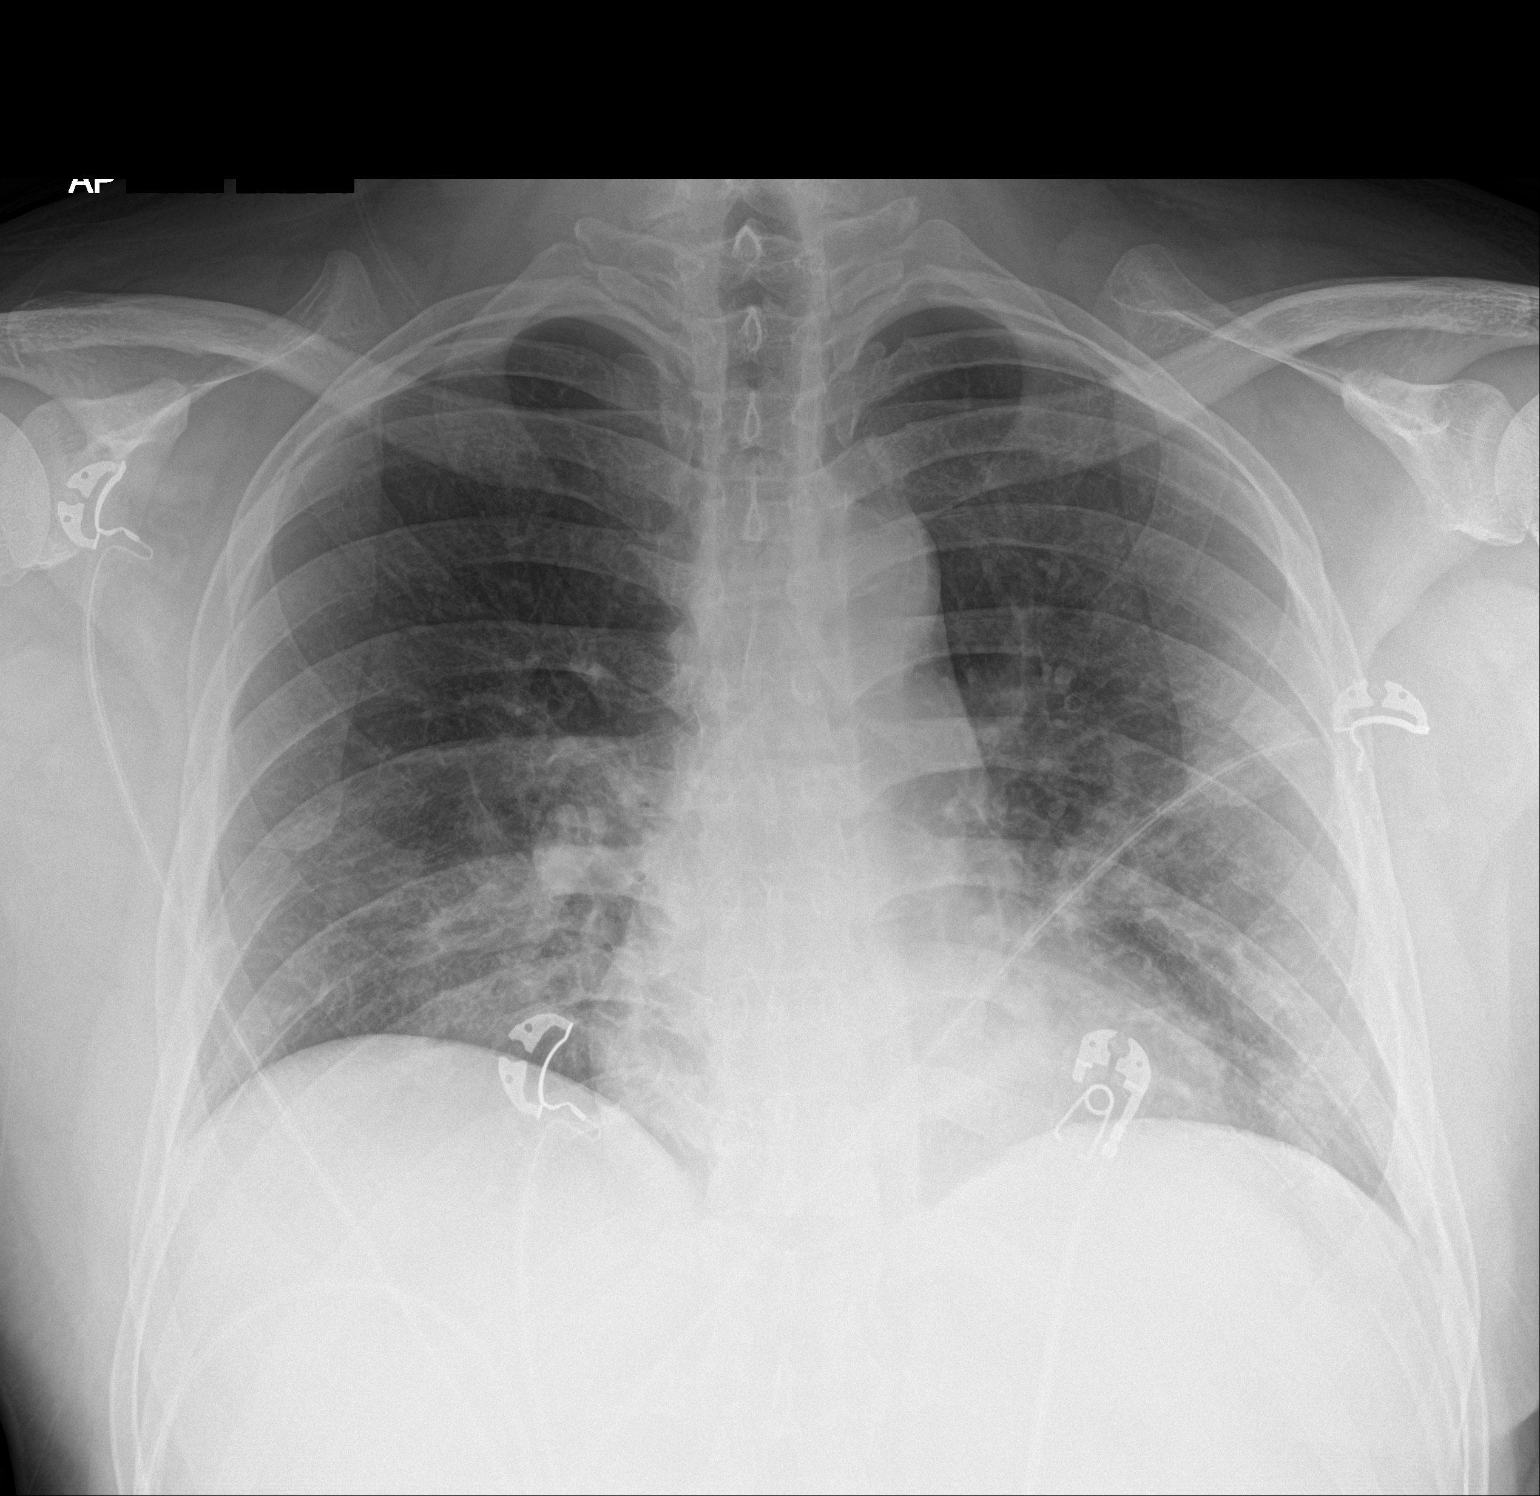

[1 of 1 positions shown; findings below may reference images not displayed]

FINDINGS: Patchy ill-defined opacities throughout the mid to lower lungs
bilaterally concerning for developing multilobar bilateral pneumonia
such as viral pneumonia. No pleural effusions. No evidence of
pulmonary edema. No pneumothorax. Heart size is normal. Upper
mediastinal contours are within normal limits.
IMPRESSION: 1. The appearance the chest is highly concerning for developing
multilobar bilateral atypical pneumonia such as viral pneumonia.
Clinical correlation for signs and symptoms of [id number] infection is
suggested.

## 2021-08-28 ENCOUNTER — Encounter (HOSPITAL_COMMUNITY): Payer: Self-pay

## 2021-08-28 ENCOUNTER — Ambulatory Visit (HOSPITAL_COMMUNITY)
Admission: EM | Admit: 2021-08-28 | Discharge: 2021-08-28 | Disposition: A | Discharge status: Home / Self Care | Payer: Self-pay | Attending: Family Medicine | Admitting: Family Medicine

## 2021-08-28 DIAGNOSIS — T7840XA Allergy, unspecified, initial encounter: Secondary | ICD-10-CM

## 2021-08-28 MED ORDER — PREDNISONE 20 MG PO TABS
40.0000 mg | ORAL_TABLET | Freq: Every day | ORAL | 0 refills | Status: AC
  Filled 2021-08-28: qty 10, 5d supply, fill #0

## 2021-08-28 MED ORDER — CEPHALEXIN 250 MG PO CAPS
250.0000 mg | ORAL_CAPSULE | Freq: Three times a day (TID) | ORAL | 0 refills | Status: AC
  Filled 2021-08-28: qty 21, 7d supply, fill #0

## 2021-08-28 MED ORDER — METHYLPREDNISOLONE ACETATE 80 MG/ML IJ SUSP
INTRAMUSCULAR | Status: AC
  Filled 2021-08-28: qty 1

## 2021-08-28 MED ORDER — METHYLPREDNISOLONE ACETATE 80 MG/ML IJ SUSP
80.0000 mg | Freq: Once | INTRAMUSCULAR | Status: AC
  Administered 2021-08-28: 80 mg via INTRAMUSCULAR

## 2021-08-28 NOTE — ED Provider Notes (Signed)
[facility]    CSN: [id number] Arrival date & time: 08/28/21  1846      History   Chief Complaint Chief Complaint  Patient presents with   Insect Bite    HPI Terry Boone is a 26 y.o. male.   HPI  Here with itching and swelling of his left foot.  It is mildly erythematous.  This all started yesterday.  He does not note a particular time he knew a bug bit him had no fever or chills.  He does hurt to put weight on that foot due to the swelling.  He also notes a little bit of peeling under his toes.  Past medical history is negative Past Medical History:  Diagnosis Date   Pneumonia due to COVID-19 virus 02/10/2019    Patient Active Problem List   Diagnosis Date Noted   Acute respiratory failure with hypoxia (HCC)    Hypoxia 02/10/2019   Pneumonia due to COVID-19 virus 02/10/2019    History reviewed. No pertinent surgical history.     Home Medications    Prior to Admission medications   Medication Sig Start Date End Date Taking? Authorizing Provider  cephALEXin (KEFLEX) 250 MG capsule Take 1 capsule (250 mg total) by mouth 3 (three) times daily for 7 days. 08/28/21 09/04/21 Yes Drury Ardizzone, Janace Aris, MD  predniSONE (DELTASONE) 20 MG tablet Take 2 tablets (40 mg total) by mouth daily with breakfast for 5 days. 08/28/21 09/02/21 Yes Abie Killian, Janace Aris, MD  albuterol (VENTOLIN HFA) 108 (90 Base) MCG/ACT inhaler Inhale 2 puffs into the lungs every 4 (four) hours as needed for wheezing or shortness of breath. 02/11/19   Meccariello, Solmon Ice, DO    Family History Family History  Problem Relation Age of Onset   Diabetes Father     Social History Social History   Tobacco Use   Smoking status: Never   Smokeless tobacco: Never  Substance Use Topics   Alcohol use: Yes   Drug use: Not Currently     Allergies   Aleve [naproxen] and Other   Review of Systems Review of Systems   Physical Exam Triage Vital Signs ED Triage Vitals [08/28/21 1933]  Enc  Vitals Group     BP 133/81     Pulse Rate 73     Resp 16     Temp 98.3 F (36.8 C)     Temp Source Oral     SpO2 100 %     Weight      Height      Head Circumference      Peak Flow      Pain Score      Pain Loc      Pain Edu?      Excl. in GC?    No data found.  Updated Vital Signs BP 133/81 (BP Location: Left Arm)   Pulse 73   Temp 98.3 F (36.8 C) (Oral)   Resp 16   SpO2 100%   Visual Acuity Right Eye Distance:   Left Eye Distance:   Bilateral Distance:    Right Eye Near:   Left Eye Near:    Bilateral Near:     Physical Exam Vitals reviewed.  Constitutional:      General: He is not in acute distress.    Appearance: He is not ill-appearing, toxic-appearing or diaphoretic.  Musculoskeletal:     Comments: There is faint erythema over the dorsum of the left foot.  There is also edema there.  It is not tender on the dorsum of his foot.  On the sole of the foot there is no erythema.  There is a little bit of peeling skin on the underside of his second and third toe.  No edema of the toes  Neurological:     General: No focal deficit present.     Mental Status: He is alert and oriented to person, place, and time.  Psychiatric:        Behavior: Behavior normal.      UC Treatments / Results  Labs (all labs ordered are listed, but only abnormal results are displayed) Labs Reviewed - No data to display  EKG   Radiology No results found.  Procedures Procedures (including critical care time)  Medications Ordered in UC Medications  methylPREDNISolone acetate (DEPO-MEDROL) injection 80 mg (has no administration in time range)    Initial Impression / Assessment and Plan / UC Course  I have reviewed the triage vital signs and the nursing notes.  Pertinent labs & imaging results that were available during my care of the patient were reviewed by me and considered in my medical decision making (see chart for details).     I do think this is more likely  allergic reaction to insect bite.  Will cover with antibiotics in case this is more of a cellulitis, though he does not have really any induration of that area Final Clinical Impressions(s) / UC Diagnoses   Final diagnoses:  Allergic reaction, initial encounter     Discharge Instructions      You have been given a shot of Depo-Medrol 80 mg tonight  Take prednisone 20 mg--2 daily for 5 days  Take cephalexin 250 mg--1 capsule 3 times daily for 7 days  Rest and elevate your foot     ED Prescriptions     Medication Sig Dispense Auth. Provider   predniSONE (DELTASONE) 20 MG tablet Take 2 tablets (40 mg total) by mouth daily with breakfast for 5 days. 10 tablet Zenia Resides, MD   cephALEXin (KEFLEX) 250 MG capsule Take 1 capsule (250 mg total) by mouth 3 (three) times daily for 7 days. 21 capsule Zenia Resides, MD      PDMP not reviewed this encounter.   Zenia Resides, MD 08/28/21 [phone]

## 2021-08-28 NOTE — Discharge Instructions (Addendum)
You have been given a shot of Depo-Medrol 80 mg tonight  Take prednisone 20 mg--2 daily for 5 days  Take cephalexin 250 mg--1 capsule 3 times daily for 7 days  Rest and elevate your foot  Put Lotrimin or clotrimazole on your peeling area on your toes.  I think that might be athlete's foot

## 2021-08-28 NOTE — ED Triage Notes (Signed)
Pt reports a insect bite on his left foot. Report redness and swollen to his foot.

## 2021-08-29 ENCOUNTER — Other Ambulatory Visit: Payer: Self-pay
